# Patient Record
Sex: Male | Born: 1975 | Race: Black or African American | Hispanic: No | Marital: Married | Smoking: Former smoker
Health system: Southern US, Community
[De-identification: ages and names within clinical notes are randomized; demographics above are authoritative.]

## PROBLEM LIST (undated history)

## (undated) DIAGNOSIS — J45909 Unspecified asthma, uncomplicated: Secondary | ICD-10-CM

## (undated) DIAGNOSIS — I10 Essential (primary) hypertension: Secondary | ICD-10-CM

---

## 2003-02-03 ENCOUNTER — Emergency Department (HOSPITAL_COMMUNITY): Admission: EM | Admit: 2003-02-03 | Discharge: 2003-02-03 | Payer: Self-pay | Admitting: *Deleted

## 2003-06-11 ENCOUNTER — Emergency Department (HOSPITAL_COMMUNITY): Admission: EM | Admit: 2003-06-11 | Discharge: 2003-06-11 | Payer: Self-pay | Admitting: Emergency Medicine

## 2003-12-07 ENCOUNTER — Emergency Department (HOSPITAL_COMMUNITY): Admission: EM | Admit: 2003-12-07 | Discharge: 2003-12-08 | Payer: Self-pay | Admitting: *Deleted

## 2018-04-18 ENCOUNTER — Emergency Department
Admission: EM | Admit: 2018-04-18 | Discharge: 2018-04-18 | Disposition: A | Discharge status: Home / Self Care | Payer: BLUE CROSS/BLUE SHIELD | Attending: Emergency Medicine | Admitting: Emergency Medicine

## 2018-04-18 ENCOUNTER — Other Ambulatory Visit: Payer: Self-pay

## 2018-04-18 DIAGNOSIS — I10 Essential (primary) hypertension: Secondary | ICD-10-CM | POA: Diagnosis not present

## 2018-04-18 DIAGNOSIS — Z760 Encounter for issue of repeat prescription: Secondary | ICD-10-CM | POA: Insufficient documentation

## 2018-04-18 MED ORDER — LISINOPRIL 10 MG PO TABS
40.0000 mg | ORAL_TABLET | Freq: Once | ORAL | Status: AC
  Administered 2018-04-18: 40 mg via ORAL
  Filled 2018-04-18: qty 4

## 2018-04-18 MED ORDER — LISINOPRIL 40 MG PO TABS: 40.0000 mg | ORAL_TABLET | Freq: Every day | ORAL | 0 refills | Status: DC

## 2018-04-18 NOTE — ED Triage Notes (Signed)
Pt just got out of prison and needs to have bp med refilled. States has been out for a week, lisinopril 40mg 

## 2018-04-18 NOTE — ED Provider Notes (Signed)
[facility] Emergency Department Provider Note ____________________________________________  Time seen: 2045  I have reviewed the triage vital signs and the nursing notes.  HISTORY  Chief Complaint  Medication Refill   HPI Antonio Stone is a 42 y.o. male presents to the ER today for medication refill.  He reports he got out of prison 1 week ago.  He is prescribed Lisinopril 40 mg daily.  He has not had his medication in 1 week.  He does report slight headache but denies dizziness, visual changes, chest pain, chest tightness or shortness of breath.  He has been taking Lisinopril for a few years and has not had any complications.  No past medical history on file.  There are no active problems to display for this patient.   Prior to Admission medications   Medication Sig Start Date End Date Taking? Authorizing Provider  lisinopril (PRINIVIL,ZESTRIL) 40 MG tablet Take 1 tablet (40 mg total) by mouth daily. 04/18/18   Jearld Fenton, NP    Allergies Patient has no allergy information on record.  No family history on file.  Social History Social History   Tobacco Use  . Smoking status: Not on file  Substance Use Topics  . Alcohol use: Not on file  . Drug use: Not on file    Review of Systems  Constitutional: Negative for fever. Eyes: Negative for visual changes. Cardiovascular: Negative for chest pain. Respiratory: Negative for shortness of breath. Neurological: Positive for headache. negative for focal weakness or numbness. ____________________________________________  PHYSICAL EXAM:  VITAL SIGNS: ED Triage Vitals [04/18/18 2037]  Enc Vitals Group     BP (!) 151/90     Pulse Rate (!) 53     Resp 20     Temp 98.6 F (37 C)     Temp Source Oral     SpO2 100 %     Weight 210 lb (95.3 kg)     Height 5\' 11"  (1.803 m)     Head Circumference      Peak Flow      Pain Score 0     Pain Loc      Pain Edu?      Excl. in [place]?      Constitutional: Alert and oriented. Well appearing and in no distress. Eyes:  PERRL.  Cardiovascular: Normal rate, regular rhythm.  Respiratory: Normal respiratory effort. No wheezes/rales/rhonchi. Neurologic:  Normal gait without ataxia. Normal speech and language. No gross focal neurologic deficits are appreciated. ____________________________________________  INITIAL IMPRESSION / ASSESSMENT AND PLAN / ED COURSE  HTN:  Lisinopril 40 mg given in ER RX provided for Lisinopril 40 mg daily He understands he will need to find a PCP for further refills ____________________________________________  FINAL CLINICAL IMPRESSION(S) / ED DIAGNOSES  Final diagnoses:  Medication refill  Essential hypertension       Jearld Fenton, NP 04/18/18 2053    Nena Polio, MD 04/19/18 [phone]

## 2018-04-18 NOTE — Discharge Instructions (Addendum)
You have been diagnosed with HTN. We gave you a dose of Lisinopril in the ER. I have provided you a prescription for Lisinopril 40 mg daily. You will need to find a PCP for additional refills and to avoid running out of your medication

## 2018-05-05 ENCOUNTER — Emergency Department
Admission: EM | Admit: 2018-05-05 | Discharge: 2018-05-05 | Disposition: A | Discharge status: Home / Self Care | Payer: BLUE CROSS/BLUE SHIELD | Attending: Emergency Medicine | Admitting: Emergency Medicine

## 2018-05-05 ENCOUNTER — Other Ambulatory Visit: Payer: Self-pay

## 2018-05-05 ENCOUNTER — Emergency Department: Payer: BLUE CROSS/BLUE SHIELD

## 2018-05-05 ENCOUNTER — Encounter: Payer: Self-pay | Admitting: Emergency Medicine

## 2018-05-05 DIAGNOSIS — J45909 Unspecified asthma, uncomplicated: Secondary | ICD-10-CM | POA: Diagnosis not present

## 2018-05-05 DIAGNOSIS — Z79899 Other long term (current) drug therapy: Secondary | ICD-10-CM | POA: Diagnosis not present

## 2018-05-05 DIAGNOSIS — R079 Chest pain, unspecified: Secondary | ICD-10-CM | POA: Diagnosis present

## 2018-05-05 DIAGNOSIS — I10 Essential (primary) hypertension: Secondary | ICD-10-CM | POA: Insufficient documentation

## 2018-05-05 HISTORY — DX: Unspecified asthma, uncomplicated: J45.909

## 2018-05-05 HISTORY — DX: Essential (primary) hypertension: I10

## 2018-05-05 LAB — CBC
HEMATOCRIT: 39.8 % (ref 39.0–52.0)
Hemoglobin: 13.6 g/dL (ref 13.0–17.0)
MCH: 30.4 pg (ref 26.0–34.0)
MCHC: 34.2 g/dL (ref 30.0–36.0)
MCV: 89 fL (ref 80.0–100.0)
NRBC: 0 % (ref 0.0–0.2)
PLATELETS: 269 10*3/uL (ref 150–400)
RBC: 4.47 MIL/uL (ref 4.22–5.81)
RDW: 11.7 % (ref 11.5–15.5)
WBC: 5.4 10*3/uL (ref 4.0–10.5)

## 2018-05-05 LAB — TROPONIN I: Troponin I: <0.03 ng/mL (ref <0.03)

## 2018-05-05 LAB — BASIC METABOLIC PANEL
ANION GAP: 6 (ref 5–15)
BUN: 21 mg/dL — ABNORMAL HIGH (ref 6–20)
CHLORIDE: 106 mmol/L (ref 98–111)
CO2: 27 mmol/L (ref 22–32)
CREATININE: 1.11 mg/dL (ref 0.61–1.24)
Calcium: 8.8 mg/dL — ABNORMAL LOW (ref 8.9–10.3)
GFR calc non Af Amer: >60 mL/min (ref >60)
Glucose, Bld: 89 mg/dL (ref 70–99)
POTASSIUM: 3.6 mmol/L (ref 3.5–5.1)
SODIUM: 139 mmol/L (ref 135–145)

## 2018-05-05 NOTE — ED Notes (Signed)
Pt ambulatory to POV without difficulty. VSS. NAD. Discharge instructions, RX and follow up reviewed. All questions and concerns addressed.  

## 2018-05-05 NOTE — ED Triage Notes (Signed)
Patient presents to the ED with left sided sharp chest pain intermittent x 1 week, radiating down left arm.  Patient is in no obvious distress at this time.  Patient reports pain at this time.  Patient denies shortness of breath, nausea, or vomiting.  Patient states he exercises frequently.

## 2018-05-05 NOTE — ED Provider Notes (Signed)
[facility] Emergency Department Provider Note  Time seen: 7:48 PM  I have reviewed the triage vital signs and the nursing notes.   HISTORY  Chief Complaint Chest Pain    HPI Antonio Stone is a 42 y.o. male with a past medical history of hypertension, asthma, presents to the emergency department for chest pain.  According to the patient over the past 1 week he has been experiencing a sharp central chest pain.  States it comes and goes.  Denies any association with movement eating or deep breathing.  States it will come randomly and leave randomly.  Denies any leg pain or swelling.  Has had some intermittent tingling of the left fourth and fifth digits, states that is somewhat worse when he works out although has taken a week off and is noted some improvement in the tingling.  No shortness of breath or nausea.   Past Medical History:  Diagnosis Date  . Asthma   . Hypertension     There are no active problems to display for this patient.   History reviewed. No pertinent surgical history.  Prior to Admission medications   Medication Sig Start Date End Date Taking? Authorizing Provider  albuterol (PROVENTIL HFA;VENTOLIN HFA) 108 (90 Base) MCG/ACT inhaler Inhale 2 puffs into the lungs every 6 (six) hours as needed for wheezing or shortness of breath.   Yes [provider]  mometasone (ASMANEX) 220 MCG/INH inhaler Inhale 2 puffs into the lungs daily.   Yes [provider]  lisinopril (PRINIVIL,ZESTRIL) 40 MG tablet Take 1 tablet (40 mg total) by mouth daily. 04/18/18   Jearld Fenton, NP    No Known Allergies  No family history on file.  Social History Social History   Tobacco Use  . Smoking status: Never Smoker  . Smokeless tobacco: Never Used  Substance Use Topics  . Alcohol use: Never    Frequency: Never  . Drug use: Never    Review of Systems Constitutional: Negative for fever. Cardiovascular: Intermittent sharp chest pain  x1 week Respiratory: Negative for shortness of breath. Gastrointestinal: Negative for abdominal pain, vomiting  Genitourinary: Negative for urinary compaints Musculoskeletal: Negative for leg pain or swelling Skin: Negative for skin complaints  Neurological: Negative for headache.  Occasional tingling in the left fourth and fifth digits. All other ROS negative  ____________________________________________   PHYSICAL EXAM:  VITAL SIGNS: ED Triage Vitals [05/05/18 1855]  Enc Vitals Group     BP 138/81     Pulse Rate (!) 50     Resp 18     Temp 98.3 F (36.8 C)     Temp Source Oral     SpO2 98 %     Weight 208 lb (94.3 kg)     Height 5\' 11"  (1.803 m)     Head Circumference      Peak Flow      Pain Score 4     Pain Loc      Pain Edu?      Excl. in [place]?    Constitutional: Alert and oriented. Well appearing and in no distress.  Muscular build. Eyes: Normal exam ENT   Head: Normocephalic and atraumatic.   Mouth/Throat: Mucous membranes are moist. Cardiovascular: Normal rate, regular rhythm. No murmur Respiratory: Normal respiratory effort without tachypnea nor retractions. Breath sounds are clear Gastrointestinal: Soft and nontender. No distention.   Musculoskeletal: Nontender with normal range of motion in all extremities. No lower extremity tenderness or edema. Neurologic:  Normal speech and language. No gross focal neurologic deficits  Skin:  Skin is warm, dry and intact.  Psychiatric: Mood and affect are normal.   ____________________________________________    EKG  EKG reviewed and interpreted by myself shows sinus bradycardia 50 bpm with a narrow QRS, normal axis, slight PR prolongation otherwise largely normal intervals with no concerning ST changes.  ____________________________________________    RADIOLOGY  Chest x-ray is clear  ____________________________________________   INITIAL IMPRESSION / ASSESSMENT AND PLAN / ED COURSE  Pertinent labs &  imaging results that were available during my care of the patient were reviewed by me and considered in my medical decision making (see chart for details).  Patient presents to the emergency department for 1 week of intermittent chest pain.  Differential would include chest wall pain, pleurisy, muscular skeletal pain, ACS.  Overall the patient appears extremely well on examination.  Normal heart and lung sounds.  No lower extremity tenderness or edema, chest is nontender to palpation.  Patient's labs are reassuring including negative troponin, chest x-ray is normal and EKG is reassuring.  I discussed with the patient follow-up with cardiology for further evaluation and a stress test.  Patient states he did have a stress test back in 2013 while he was incarcerated and states it was normal.  ____________________________________________   FINAL CLINICAL IMPRESSION(S) / ED DIAGNOSES  Chest pain    Harvest Dark, MD 05/05/18 1951

## 2018-05-05 NOTE — ED Notes (Signed)
Pt to the er for chest pain intermittent x 1 week. Pt has a hx of HTN and asthma. Pt unable to state any factors for improvement or worsening. Pain is intermittent and sharp and is diaphoretic at times. Pain the midline under left breast. Pt has numbness to the medial right hand along the 4th and 5th digits.

## 2018-09-14 ENCOUNTER — Ambulatory Visit: Payer: Self-pay

## 2018-09-29 ENCOUNTER — Encounter (INDEPENDENT_AMBULATORY_CARE_PROVIDER_SITE_OTHER): Payer: Self-pay

## 2018-09-30 ENCOUNTER — Ambulatory Visit: Payer: Medicaid Other | Admitting: Gerontology

## 2018-09-30 ENCOUNTER — Other Ambulatory Visit: Payer: Self-pay

## 2018-09-30 ENCOUNTER — Encounter: Payer: Self-pay | Admitting: Gerontology

## 2018-09-30 VITALS — BP 130/80 | HR 61 | Temp 98.0°F | Ht 71.0 in | Wt 233.9 lb

## 2018-09-30 DIAGNOSIS — Z0000 Encounter for general adult medical examination without abnormal findings: Secondary | ICD-10-CM

## 2018-09-30 DIAGNOSIS — J45909 Unspecified asthma, uncomplicated: Secondary | ICD-10-CM

## 2018-09-30 DIAGNOSIS — K0889 Other specified disorders of teeth and supporting structures: Secondary | ICD-10-CM

## 2018-09-30 DIAGNOSIS — I10 Essential (primary) hypertension: Secondary | ICD-10-CM

## 2018-09-30 DIAGNOSIS — H538 Other visual disturbances: Secondary | ICD-10-CM

## 2018-09-30 DIAGNOSIS — Z7689 Persons encountering health services in other specified circumstances: Secondary | ICD-10-CM

## 2018-09-30 MED ORDER — LISINOPRIL 40 MG PO TABS
40.0000 mg | ORAL_TABLET | Freq: Every day | ORAL | 3 refills | Status: AC
Start: 2018-09-30 — End: ?

## 2018-09-30 MED ORDER — IPRATROPIUM BROMIDE 0.06 % NA SOLN: 2.0000 | Freq: Four times a day (QID) | NASAL | 3 refills | Status: AC

## 2018-09-30 MED ORDER — FLUTICASONE-SALMETEROL 250-50 MCG/DOSE IN AEPB: 1.0000 | INHALATION_SPRAY | Freq: Two times a day (BID) | RESPIRATORY_TRACT | 1 refills | Status: AC

## 2018-09-30 MED ORDER — ALBUTEROL SULFATE HFA 108 (90 BASE) MCG/ACT IN AERS: 2.0000 | INHALATION_SPRAY | Freq: Four times a day (QID) | RESPIRATORY_TRACT | 3 refills | Status: AC | PRN

## 2018-09-30 MED ORDER — MOMETASONE FUROATE 220 MCG/INH IN AEPB: 2.0000 | INHALATION_SPRAY | Freq: Every day | RESPIRATORY_TRACT | 3 refills | Status: DC

## 2018-09-30 NOTE — Progress Notes (Signed)
Patient ID: Antonio Stone, male   DOB: [birth year], 43 y.o.   MRN: [id number]  Chief Complaint  Patient presents with  . New Patient (Initial Visit)    establish care, high blood pressure, asthma, acute bronchitis    HPI Antonio Stone is a 43 y.o. male who presents for initial visit and evaluation of hypertension and asthma . Hypertension: He states that he was diagnosed with hypertension 12 years ago and has being taking 40 mg Lisinopril daily, and will soon run out of his medication. His blood pressure was 130/80 during office visit and he doesn't check his blood pressure at home. He denies cough, headache, light headedness and chest pain.  Asthma: He states that his Asthma is under control and he takes  Asmanex 2 puffs daily, Atrovent 2 puffs bid and Albuterol as needed. He denies chest tightness, wheezing, shortness of breath and cough.  Blurry vision: He states that he's having intermittent blurry vision to both eyes,and he wears glasses. He states that it has being over one year since he had an eye exam.   Tooth ache: He states that he's having intermittent discomfort to his right upper tooth. He reports that it started 1 month ago after he realized that the crown on the tooth fell off. He states that he experiences discomfort sometimes when he chews on the right side. He denies gum swelling, fever, chills, and otherwise no further concerns.  Past Medical History:  Diagnosis Date  . Asthma   . Hypertension     No past surgical history on file.  No family history on file.  Social History Social History   Tobacco Use  . Smoking status: Never Smoker  . Smokeless tobacco: Never Used  Substance Use Topics  . Alcohol use: Never    Frequency: Never  . Drug use: Never    No Known Allergies  Current Outpatient Medications  Medication Sig Dispense Refill  . albuterol (PROVENTIL HFA;VENTOLIN HFA) 108 (90 Base) MCG/ACT inhaler Inhale 2 puffs into the lungs every 6 (six) hours as  needed for wheezing or shortness of breath.    . lisinopril (PRINIVIL,ZESTRIL) 40 MG tablet Take 1 tablet (40 mg total) by mouth daily. 30 tablet 0  . mometasone (ASMANEX) 220 MCG/INH inhaler Inhale 2 puffs into the lungs daily.     No current facility-administered medications for this visit.     Review of Systems Review of Systems  Constitutional: Negative.   HENT: Negative.  Dental problem: tooth ache.   Eyes: Positive for visual disturbance (blurry vision).  Respiratory: Negative.   Cardiovascular: Negative.   Gastrointestinal: Negative.   Musculoskeletal: Negative.   Skin: Negative.   Neurological: Negative.   Hematological: Negative.   Psychiatric/Behavioral: Negative.     Height 5' 11" (1.803 m), weight 233 lb 14.4 oz (106.1 kg).  Physical Exam Physical Exam HENT:     Head: Normocephalic and atraumatic.     Nose: Nose normal.     Mouth/Throat:     Dentition: Normal dentition. No dental tenderness or dental abscesses.     Tongue: No lesions.     Pharynx: Oropharynx is clear.     Tonsils: No tonsillar exudate or tonsillar abscesses.  Eyes:     Pupils: Pupils are equal, round, and reactive to light.  Neck:     Musculoskeletal: Normal range of motion.  Cardiovascular:     Rate and Rhythm: Normal rate and regular rhythm.  Pulmonary:     Effort: Pulmonary effort is  normal.     Breath sounds: Normal breath sounds.  Abdominal:     General: Abdomen is flat. Bowel sounds are normal.     Palpations: Abdomen is soft.  Musculoskeletal: Normal range of motion.  Skin:    General: Skin is warm and dry.  Neurological:     General: No focal deficit present.     Mental Status: He is alert and oriented to person, place, and time.  Psychiatric:        Mood and Affect: Mood normal.        Behavior: Behavior normal.        Thought Content: Thought content normal.        Judgment: Judgment normal.     Data Reviewed - Medications and past medical history reviewed. Routine  labs ordered.  CBC w/Diff; Future - Comp Met (CMET); Future - Lipid panel; Future - HgB A1c; Future - Urinalysis; Future - TSH; Future   Assessment and Plan  1. Encounter to establish care  - CBC w/Diff; Future - Comp Met (CMET); Future - Lipid panel; Future - HgB A1c; Future - Urinalysis; Future - TSH; Future  2. Essential hypertension - Blood pressure is well controlled, will continue on current treatment regimen. . - Low salt DASH diet . Take medications regularly on time . Exercise regularly as tolerated . Check blood pressure at least once a week at home or a nearby pharmacy and record . Goal is less than 140/90 and normal blood pressure is 120/80  - lisinopril (PRINIVIL,ZESTRIL) 40 MG tablet; Take 1 tablet (40 mg total) by mouth daily.  Dispense: 30 tablet; Refill: 3  3. Mild asthma, unspecified whether complicated, unspecified whether persistent - He will continue on current treatment regimen. - albuterol (PROVENTIL HFA;VENTOLIN HFA) 108 (90 Base) MCG/ACT inhaler; Inhale 2 puffs into the lungs every 6 (six) hours as needed for wheezing or shortness of breath.  Dispense: 18 g; Refill: 3 - ipratropium (ATROVENT) 0.06 % nasal spray; Place 2 sprays into both nostrils 4 (four) times daily.  Dispense: 15 mL; Refill: 3 - mometasone (ASMANEX) 220 MCG/INH inhaler; Inhale 2 puffs into the lungs daily.  Dispense: 1 Inhaler; Refill: 3  4. Blurry vision - He is scheduled for Ophthalmology appointment.  5. Tooth ache - He was encouraged to complete application for -Dental referral.  6. Health care maintenance: He declines Influenza vaccine .                 Follow up in 3 months or if symptoms worsens.    Chioma E Iloabachie 09/30/2018, 9:14 AM   

## 2018-09-30 NOTE — Patient Instructions (Signed)
DASH Eating Plan  DASH stands for "Dietary Approaches to Stop Hypertension." The DASH eating plan is a healthy eating plan that has been shown to reduce high blood pressure (hypertension). It may also reduce your risk for type 2 diabetes, heart disease, and stroke. The DASH eating plan may also help with weight loss.  What are tips for following this plan?    General guidelines   Avoid eating more than 2,300 mg (milligrams) of salt (sodium) a day. If you have hypertension, you may need to reduce your sodium intake to 1,500 mg a day.   Limit alcohol intake to no more than 1 drink a day for nonpregnant women and 2 drinks a day for men. One drink equals 12 oz of beer, 5 oz of wine, or 1 oz of hard liquor.   Work with your health care provider to maintain a healthy body weight or to lose weight. Ask what an ideal weight is for you.   Get at least 30 minutes of exercise that causes your heart to beat faster (aerobic exercise) most days of the week. Activities may include walking, swimming, or biking.   Work with your health care provider or diet and nutrition specialist (dietitian) to adjust your eating plan to your individual calorie needs.  Reading food labels     Check food labels for the amount of sodium per serving. Choose foods with less than 5 percent of the Daily Value of sodium. Generally, foods with less than 300 mg of sodium per serving fit into this eating plan.   To find whole grains, look for the word "whole" as the first word in the ingredient list.  Shopping   Buy products labeled as "low-sodium" or "no salt added."   Buy fresh foods. Avoid canned foods and premade or frozen meals.  Cooking   Avoid adding salt when cooking. Use salt-free seasonings or herbs instead of table salt or sea salt. Check with your health care provider or pharmacist before using salt substitutes.   Do not fry foods. Cook foods using healthy methods such as baking, boiling, grilling, and broiling instead.   Cook with  heart-healthy oils, such as olive, canola, soybean, or sunflower oil.  Meal planning   Eat a balanced diet that includes:  ? 5 or more servings of fruits and vegetables each day. At each meal, try to fill half of your plate with fruits and vegetables.  ? Up to 6-8 servings of whole grains each day.  ? Less than 6 oz of lean meat, poultry, or fish each day. A 3-oz serving of meat is about the same size as a deck of cards. One egg equals 1 oz.  ? 2 servings of low-fat dairy each day.  ? A serving of nuts, seeds, or beans 5 times each week.  ? Heart-healthy fats. Healthy fats called Omega-3 fatty acids are found in foods such as flaxseeds and coldwater fish, like sardines, salmon, and mackerel.   Limit how much you eat of the following:  ? Canned or prepackaged foods.  ? Food that is high in trans fat, such as fried foods.  ? Food that is high in saturated fat, such as fatty meat.  ? Sweets, desserts, sugary drinks, and other foods with added sugar.  ? Full-fat dairy products.   Do not salt foods before eating.   Try to eat at least 2 vegetarian meals each week.   Eat more home-cooked food and less restaurant, buffet, and fast food.     When eating at a restaurant, ask that your food be prepared with less salt or no salt, if possible.  What foods are recommended?  The items listed may not be a complete list. Talk with your dietitian about what dietary choices are best for you.  Grains  Whole-grain or whole-wheat bread. Whole-grain or whole-wheat pasta. Brown rice. Oatmeal. Quinoa. Bulgur. Whole-grain and low-sodium cereals. Pita bread. Low-fat, low-sodium crackers. Whole-wheat flour tortillas.  Vegetables  Fresh or frozen vegetables (raw, steamed, roasted, or grilled). Low-sodium or reduced-sodium tomato and vegetable juice. Low-sodium or reduced-sodium tomato sauce and tomato paste. Low-sodium or reduced-sodium canned vegetables.  Fruits  All fresh, dried, or frozen fruit. Canned fruit in natural juice (without  added sugar).  Meat and other protein foods  Skinless chicken or turkey. Ground chicken or turkey. Pork with fat trimmed off. Fish and seafood. Egg whites. Dried beans, peas, or lentils. Unsalted nuts, nut butters, and seeds. Unsalted canned beans. Lean cuts of beef with fat trimmed off. Low-sodium, lean deli meat.  Dairy  Low-fat (1%) or fat-free (skim) milk. Fat-free, low-fat, or reduced-fat cheeses. Nonfat, low-sodium ricotta or cottage cheese. Low-fat or nonfat yogurt. Low-fat, low-sodium cheese.  Fats and oils  Soft margarine without trans fats. Vegetable oil. Low-fat, reduced-fat, or light mayonnaise and salad dressings (reduced-sodium). Canola, safflower, olive, soybean, and sunflower oils. Avocado.  Seasoning and other foods  Herbs. Spices. Seasoning mixes without salt. Unsalted popcorn and pretzels. Fat-free sweets.  What foods are not recommended?  The items listed may not be a complete list. Talk with your dietitian about what dietary choices are best for you.  Grains  Baked goods made with fat, such as croissants, muffins, or some breads. Dry pasta or rice meal packs.  Vegetables  Creamed or fried vegetables. Vegetables in a cheese sauce. Regular canned vegetables (not low-sodium or reduced-sodium). Regular canned tomato sauce and paste (not low-sodium or reduced-sodium). Regular tomato and vegetable juice (not low-sodium or reduced-sodium). Pickles. Olives.  Fruits  Canned fruit in a light or heavy syrup. Fried fruit. Fruit in cream or butter sauce.  Meat and other protein foods  Fatty cuts of meat. Ribs. Fried meat. Bacon. Sausage. Bologna and other processed lunch meats. Salami. Fatback. Hotdogs. Bratwurst. Salted nuts and seeds. Canned beans with added salt. Canned or smoked fish. Whole eggs or egg yolks. Chicken or turkey with skin.  Dairy  Whole or 2% milk, cream, and half-and-half. Whole or full-fat cream cheese. Whole-fat or sweetened yogurt. Full-fat cheese. Nondairy creamers. Whipped toppings.  Processed cheese and cheese spreads.  Fats and oils  Butter. Stick margarine. Lard. Shortening. Ghee. Bacon fat. Tropical oils, such as coconut, palm kernel, or palm oil.  Seasoning and other foods  Salted popcorn and pretzels. Onion salt, garlic salt, seasoned salt, table salt, and sea salt. Worcestershire sauce. Tartar sauce. Barbecue sauce. Teriyaki sauce. Soy sauce, including reduced-sodium. Steak sauce. Canned and packaged gravies. Fish sauce. Oyster sauce. Cocktail sauce. Horseradish that you find on the shelf. Ketchup. Mustard. Meat flavorings and tenderizers. Bouillon cubes. Hot sauce and Tabasco sauce. Premade or packaged marinades. Premade or packaged taco seasonings. Relishes. Regular salad dressings.  Where to find more information:   National Heart, Lung, and Blood Institute: www.nhlbi.nih.gov   American Heart Association: www.heart.org  Summary   The DASH eating plan is a healthy eating plan that has been shown to reduce high blood pressure (hypertension). It may also reduce your risk for type 2 diabetes, heart disease, and stroke.   With the   DASH eating plan, you should limit salt (sodium) intake to 2,300 mg a day. If you have hypertension, you may need to reduce your sodium intake to 1,500 mg a day.   When on the DASH eating plan, aim to eat more fresh fruits and vegetables, whole grains, lean proteins, low-fat dairy, and heart-healthy fats.   Work with your health care provider or diet and nutrition specialist (dietitian) to adjust your eating plan to your individual calorie needs.  This information is not intended to replace advice given to you by your health care provider. Make sure you discuss any questions you have with your health care provider.  Document Released: 06/19/2011 Document Revised: 06/23/2016 Document Reviewed: 06/23/2016  Elsevier Interactive Patient Education  2019 Elsevier Inc.

## 2018-10-01 LAB — CBC WITH DIFFERENTIAL/PLATELET
BASOS: 1 %
Basophils Absolute: 0.1 10*3/uL (ref 0.0–0.2)
EOS (ABSOLUTE): 0.3 10*3/uL (ref 0.0–0.4)
Eos: 5 %
Hematocrit: 43.3 % (ref 37.5–51.0)
Hemoglobin: 14.8 g/dL (ref 13.0–17.7)
IMMATURE GRANULOCYTES: 0 %
Immature Grans (Abs): 0 10*3/uL (ref 0.0–0.1)
Lymphocytes Absolute: 2.2 10*3/uL (ref 0.7–3.1)
Lymphs: 45 %
MCH: 30.3 pg (ref 26.6–33.0)
MCHC: 34.2 g/dL (ref 31.5–35.7)
MCV: 89 fL (ref 79–97)
MONOS ABS: 0.5 10*3/uL (ref 0.1–0.9)
Monocytes: 10 %
NEUTROS PCT: 39 %
Neutrophils Absolute: 2 10*3/uL (ref 1.4–7.0)
PLATELETS: 279 10*3/uL (ref 150–450)
RBC: 4.88 x10E6/uL (ref 4.14–5.80)
RDW: 12.4 % (ref 11.6–15.4)
WBC: 5 10*3/uL (ref 3.4–10.8)

## 2018-10-01 LAB — COMPREHENSIVE METABOLIC PANEL
A/G RATIO: 1.8 (ref 1.2–2.2)
ALT: 28 IU/L (ref 0–44)
AST: 30 IU/L (ref 0–40)
Albumin: 4.4 g/dL (ref 4.0–5.0)
Alkaline Phosphatase: 81 IU/L (ref 39–117)
BILIRUBIN TOTAL: 0.7 mg/dL (ref 0.0–1.2)
BUN/Creatinine Ratio: 17 (ref 9–20)
BUN: 19 mg/dL (ref 6–24)
CALCIUM: 9.7 mg/dL (ref 8.7–10.2)
CO2: 21 mmol/L (ref 20–29)
Chloride: 105 mmol/L (ref 96–106)
Creatinine, Ser: 1.14 mg/dL (ref 0.76–1.27)
GFR calc Af Amer: 91 mL/min/{1.73_m2} (ref >59)
GFR, EST NON AFRICAN AMERICAN: 78 mL/min/{1.73_m2} (ref >59)
GLOBULIN, TOTAL: 2.4 g/dL (ref 1.5–4.5)
Glucose: 85 mg/dL (ref 65–99)
POTASSIUM: 4.8 mmol/L (ref 3.5–5.2)
SODIUM: 143 mmol/L (ref 134–144)
Total Protein: 6.8 g/dL (ref 6.0–8.5)

## 2018-10-01 LAB — URINALYSIS
Bilirubin, UA: NEGATIVE
Glucose, UA: NEGATIVE
Ketones, UA: NEGATIVE
LEUKOCYTES UA: NEGATIVE
NITRITE UA: NEGATIVE
PH UA: 5 (ref 5.0–7.5)
Protein, UA: NEGATIVE
RBC, UA: NEGATIVE
Specific Gravity, UA: 1.022 (ref 1.005–1.030)
Urobilinogen, Ur: 0.2 mg/dL (ref 0.2–1.0)

## 2018-10-01 LAB — LIPID PANEL
CHOLESTEROL TOTAL: 128 mg/dL (ref 100–199)
Chol/HDL Ratio: 2.9 ratio (ref 0.0–5.0)
HDL: 44 mg/dL (ref >39)
LDL CALC: 74 mg/dL (ref 0–99)
TRIGLYCERIDES: 50 mg/dL (ref 0–149)
VLDL Cholesterol Cal: 10 mg/dL (ref 5–40)

## 2018-10-01 LAB — HEMOGLOBIN A1C
Est. average glucose Bld gHb Est-mCnc: 111 mg/dL
Hgb A1c MFr Bld: 5.5 % (ref 4.8–5.6)

## 2018-10-01 LAB — TSH: TSH: 1.71 u[IU]/mL (ref 0.450–4.500)

## 2018-10-04 ENCOUNTER — Ambulatory Visit: Payer: BLUE CROSS/BLUE SHIELD | Admitting: Pharmacy Technician

## 2018-10-04 ENCOUNTER — Other Ambulatory Visit: Payer: Self-pay

## 2018-10-04 DIAGNOSIS — Z79899 Other long term (current) drug therapy: Secondary | ICD-10-CM

## 2018-10-04 NOTE — Progress Notes (Signed)
Completed phone consult.  Patient had previously completed application.  Went over application with patient.    Patient still needs to provide last 30 days of paystubs and 2019 tax return when available.  Verbally read [facility]'s contract with patient over the phone.  Patient verbally acknowledged that he understood and had no questions about the contract.  Mailing contract to patient.  Patient to sign and return to [facility].    Explained that we need to order Ventolin and Advair from [employer].  Mailing to patient to sign and sending to [facility] for provider to sign.  Upon receipt of signed portions of the PAP Application from patient and provider, and receipt of financial information from patient, PAP Applications will be submitted to [place].  [facility]

## 2018-10-05 ENCOUNTER — Telehealth: Payer: Self-pay | Admitting: Pharmacy Technician

## 2018-10-05 NOTE — Telephone Encounter (Signed)
Discovered patient has [employer] [place].  Subscriber ID# [id number].  Spoke with Barista at [employer].  Jinny Blossom verified that policy is still active and that patient has prescription benefits with [facility] in [place].    Contacted patient to make aware and notify that we need a termination letter in order to provide additional medication assistance.  Patient stated that the [employer]BS was inactive and he owed a Engineer, technical sales with [employer].  Explained that I had contacted someone from [place] and they verified that policy is still active and that he has prescription benefits.  Provided patient with contact information for [employer]BS.  Also, provided patient with fax number to forward termination letter.  [facility]

## 2018-12-15 ENCOUNTER — Other Ambulatory Visit: Payer: Self-pay | Admitting: Physician Assistant

## 2018-12-15 DIAGNOSIS — H4901 Third [oculomotor] nerve palsy, right eye: Secondary | ICD-10-CM

## 2018-12-30 ENCOUNTER — Ambulatory Visit: Payer: Medicaid Other | Admitting: Gerontology

## 2019-01-04 ENCOUNTER — Ambulatory Visit
Admission: RE | Admit: 2019-01-04 | Discharge: 2019-01-04 | Disposition: A | Discharge status: Home / Self Care | Payer: Managed Care, Other (non HMO) | Source: Ambulatory Visit | Attending: Physician Assistant | Admitting: Physician Assistant

## 2019-01-04 ENCOUNTER — Other Ambulatory Visit: Payer: Self-pay

## 2019-01-04 DIAGNOSIS — H4901 Third [oculomotor] nerve palsy, right eye: Secondary | ICD-10-CM

## 2019-01-04 LAB — POCT I-STAT CREATININE: Creatinine, Ser: 1.2 mg/dL (ref 0.61–1.24)

## 2019-01-04 MED ORDER — IOHEXOL 350 MG/ML SOLN
75.0000 mL | Freq: Once | INTRAVENOUS | Status: AC | PRN
  Administered 2019-01-04: 75 mL via INTRAVENOUS

## 2019-05-16 ENCOUNTER — Other Ambulatory Visit: Payer: Self-pay | Admitting: *Deleted

## 2019-05-16 DIAGNOSIS — Z20822 Contact with and (suspected) exposure to covid-19: Secondary | ICD-10-CM

## 2019-05-17 ENCOUNTER — Telehealth: Payer: Self-pay

## 2019-05-17 LAB — NOVEL CORONAVIRUS, NAA: SARS-CoV-2, NAA: DETECTED — AB

## 2019-05-17 NOTE — Telephone Encounter (Signed)
Received call from patient checking Covid results.  Advised results are positive.  Advised to contact PCP or visit ED if symptoms worsen.   

## 2019-05-27 ENCOUNTER — Other Ambulatory Visit: Payer: Self-pay

## 2019-05-27 DIAGNOSIS — Z20822 Contact with and (suspected) exposure to covid-19: Secondary | ICD-10-CM

## 2019-05-30 LAB — NOVEL CORONAVIRUS, NAA: SARS-CoV-2, NAA: NOT DETECTED

## 2020-11-15 ENCOUNTER — Telehealth: Payer: Self-pay | Admitting: Gastroenterology

## 2020-11-15 NOTE — Telephone Encounter (Signed)
Patient came by office to bring his referral.  The referral was not complete.  I called and spoke to someone at [facility] who assured me she would send that over through fax.  I also faxed a request to [facility].

## 2020-11-21 ENCOUNTER — Other Ambulatory Visit: Payer: Self-pay

## 2020-11-21 ENCOUNTER — Telehealth: Payer: Self-pay

## 2020-11-21 DIAGNOSIS — Z1211 Encounter for screening for malignant neoplasm of colon: Secondary | ICD-10-CM

## 2020-11-21 MED ORDER — NA SULFATE-K SULFATE-MG SULF 17.5-3.13-1.6 GM/177ML PO SOLN: 354.0000 mL | Freq: Once | ORAL | 0 refills | Status: AC

## 2020-11-21 NOTE — Telephone Encounter (Signed)
Gastroenterology Pre-Procedure Review  Request Date: 12/03/2020 Requesting Physician: Dr. Bonna Gains   PATIENT REVIEW QUESTIONS: The patient responded to the following health history questions as indicated:    1. Are you having any GI issues? Yes constipation Twice a day  2. Do you have a personal history of Polyps? no 3. Do you have a family history of Colon Cancer or Polyps? no 4. Diabetes Mellitus? no 5. Joint replacements in the past 12 months?no 6. Major health problems in the past 3 months?no 7. Any artificial heart valves, MVP, or defibrillator?no    MEDICATIONS & ALLERGIES:    Patient reports the following regarding taking any anticoagulation/antiplatelet therapy:   Plavix, Coumadin, Eliquis, Xarelto, Lovenox, Pradaxa, Brilinta, or Effient? no Aspirin? no  Patient confirms/reports the following medications:  Current Outpatient Medications  Medication Sig Dispense Refill  . albuterol (PROVENTIL HFA;VENTOLIN HFA) 108 (90 Base) MCG/ACT inhaler Inhale 2 puffs into the lungs every 6 (six) hours as needed for wheezing or shortness of breath. 18 g 3  . Fluticasone-Salmeterol (ADVAIR) 250-50 MCG/DOSE AEPB Inhale 1 puff into the lungs 2 (two) times daily. 60 each 1  . ipratropium (ATROVENT) 0.06 % nasal spray Place 2 sprays into both nostrils 4 (four) times daily. 15 mL 3  . lisinopril (PRINIVIL,ZESTRIL) 40 MG tablet Take 1 tablet (40 mg total) by mouth daily. 30 tablet 3   No current facility-administered medications for this visit.    Patient confirms/reports the following allergies:  No Known Allergies  No orders of the defined types were placed in this encounter.   AUTHORIZATION INFORMATION Primary Insurance: 1D#: Group #:  Secondary Insurance: 1D#: Group #:  SCHEDULE INFORMATION: Date:  Time: Location:

## 2020-11-22 ENCOUNTER — Encounter: Payer: Self-pay | Admitting: Family Medicine

## 2020-12-03 ENCOUNTER — Ambulatory Visit: Payer: BC Managed Care – PPO | Admitting: Anesthesiology

## 2020-12-03 ENCOUNTER — Encounter
Admission: RE | Disposition: A | Discharge status: Home / Self Care | Payer: Self-pay | Source: Ambulatory Visit | Attending: Gastroenterology

## 2020-12-03 ENCOUNTER — Encounter: Payer: Self-pay | Admitting: Gastroenterology

## 2020-12-03 ENCOUNTER — Ambulatory Visit
Admission: RE | Admit: 2020-12-03 | Discharge: 2020-12-03 | Disposition: A | Discharge status: Home / Self Care | Payer: BC Managed Care – PPO | Source: Ambulatory Visit | Attending: Gastroenterology | Admitting: Gastroenterology

## 2020-12-03 DIAGNOSIS — D120 Benign neoplasm of cecum: Secondary | ICD-10-CM | POA: Diagnosis not present

## 2020-12-03 DIAGNOSIS — Z7951 Long term (current) use of inhaled steroids: Secondary | ICD-10-CM | POA: Diagnosis not present

## 2020-12-03 DIAGNOSIS — K648 Other hemorrhoids: Secondary | ICD-10-CM | POA: Diagnosis not present

## 2020-12-03 DIAGNOSIS — Z79899 Other long term (current) drug therapy: Secondary | ICD-10-CM | POA: Diagnosis not present

## 2020-12-03 DIAGNOSIS — K635 Polyp of colon: Secondary | ICD-10-CM

## 2020-12-03 DIAGNOSIS — J45909 Unspecified asthma, uncomplicated: Secondary | ICD-10-CM | POA: Insufficient documentation

## 2020-12-03 DIAGNOSIS — I10 Essential (primary) hypertension: Secondary | ICD-10-CM | POA: Insufficient documentation

## 2020-12-03 DIAGNOSIS — Z1211 Encounter for screening for malignant neoplasm of colon: Secondary | ICD-10-CM | POA: Diagnosis present

## 2020-12-03 HISTORY — PX: COLONOSCOPY WITH PROPOFOL: SHX5780

## 2020-12-03 SURGERY — COLONOSCOPY WITH PROPOFOL
Anesthesia: General

## 2020-12-03 MED ORDER — SODIUM CHLORIDE 0.9 % IV SOLN
INTRAVENOUS | Status: DC
  Administered 2020-12-03: 20 mL/h via INTRAVENOUS

## 2020-12-03 MED ORDER — LIDOCAINE HCL (CARDIAC) PF 100 MG/5ML IV SOSY
PREFILLED_SYRINGE | INTRAVENOUS | Status: DC | PRN
  Administered 2020-12-03: 60 mg via INTRAVENOUS

## 2020-12-03 MED ORDER — PROPOFOL 500 MG/50ML IV EMUL
INTRAVENOUS | Status: DC | PRN
  Administered 2020-12-03: 140 ug/kg/min via INTRAVENOUS

## 2020-12-03 MED ORDER — PROPOFOL 10 MG/ML IV BOLUS
INTRAVENOUS | Status: DC | PRN
  Administered 2020-12-03: 70 mg via INTRAVENOUS

## 2020-12-03 MED ORDER — GLYCOPYRROLATE 0.2 MG/ML IJ SOLN
INTRAMUSCULAR | Status: DC | PRN
  Administered 2020-12-03: .2 mg via INTRAVENOUS

## 2020-12-03 NOTE — Transfer of Care (Signed)
Immediate Anesthesia Transfer of Care Note  Patient: Antonio Stone  Procedure(s) Performed: COLONOSCOPY WITH PROPOFOL (N/A )  Patient Location: PACU  Anesthesia Type:General  Level of Consciousness: sedated  Airway & Oxygen Therapy: Patient Spontanous Breathing and Patient connected to nasal cannula oxygen  Post-op Assessment: Report given to RN and Post -op Vital signs reviewed and stable  Post vital signs: Reviewed and stable  Last Vitals:  Vitals Value Taken Time  BP 116/59 12/03/20 1040  Temp 35.7 C 12/03/20 1040  Pulse 54 12/03/20 1045  Resp 21 12/03/20 1045  SpO2 97 % 12/03/20 1045  Vitals shown include unvalidated device data.  Last Pain:  Vitals:   12/03/20 1040  TempSrc: Tympanic  PainSc: Asleep         Complications: No complications documented.

## 2020-12-03 NOTE — H&P (Signed)
  Vonda Antigua, MD [address] [place], [place], [place], [address] [address], [place], [place], [place], [address] Phone: [phone]  Fax: [phone]  Primary Care Physician:  Denton Lank, MD   Pre-Procedure History & Physical: HPI:  Antonio Stone is a 45 y.o. male is here for a colonoscopy.   Past Medical History:  Diagnosis Date  . Asthma   . Hypertension     No past surgical history on file.  Prior to Admission medications   Medication Sig Start Date End Date Taking? Authorizing Provider  albuterol (PROVENTIL HFA;VENTOLIN HFA) 108 (90 Base) MCG/ACT inhaler Inhale 2 puffs into the lungs every 6 (six) hours as needed for wheezing or shortness of breath. 09/30/18  Yes Iloabachie, Chioma E, NP  Fluticasone-Salmeterol (ADVAIR) 250-50 MCG/DOSE AEPB Inhale 1 puff into the lungs 2 (two) times daily. 09/30/18  Yes Iloabachie, Chioma E, NP  ipratropium (ATROVENT) 0.06 % nasal spray Place 2 sprays into both nostrils 4 (four) times daily. 09/30/18  Yes Iloabachie, Chioma E, NP  lisinopril (PRINIVIL,ZESTRIL) 40 MG tablet Take 1 tablet (40 mg total) by mouth daily. 09/30/18  Yes Iloabachie, Chioma E, NP    Allergies as of 11/21/2020  . (No Known Allergies)    No family history on file.  Social History   Socioeconomic History  . Marital status: Married    Spouse name: Not on file  . Number of children: Not on file  . Years of education: Not on file  . Highest education level: Not on file  Occupational History  . Not on file  Tobacco Use  . Smoking status: Never Smoker  . Smokeless tobacco: Never Used  Vaping Use  . Vaping Use: Never used  Substance and Sexual Activity  . Alcohol use: Never  . Drug use: Never  . Sexual activity: Not on file  Other Topics Concern  . Not on file  Social History Narrative  . Not on file   Social Determinants of Health   Financial Resource Strain: Not on file  Food Insecurity: Not on file  Transportation Needs: Not on file   Physical Activity: Not on file  Stress: Not on file  Social Connections: Not on file  Intimate Partner Violence: Not on file    Review of Systems: See HPI, otherwise negative ROS  Physical Exam: BP (!) 152/91   Pulse (!) 54   Temp (!) 97.4 F (36.3 C) (Temporal)   Resp 20   Ht 5\' 11"  (1.803 m)   Wt 102.5 kg   SpO2 99%   BMI 31.52 kg/m  General:   Alert,  pleasant and cooperative in NAD Head:  Normocephalic and atraumatic. Neck:  Supple; no masses or thyromegaly. Lungs:  Clear throughout to auscultation, normal respiratory effort.    Heart:  +S1, +S2, Regular rate and rhythm, No edema. Abdomen:  Soft, nontender and nondistended. Normal bowel sounds, without guarding, and without rebound.   Neurologic:  Alert and  oriented x4;  grossly normal neurologically.  Impression/Plan: Antonio Stone is here for a colonoscopy to be performed for average risk screening.  Risks, benefits, limitations, and alternatives regarding  colonoscopy have been reviewed with the patient.  Questions have been answered.  All parties agreeable.   Virgel Manifold, MD  12/03/2020, 10:01 AM

## 2020-12-03 NOTE — Op Note (Signed)
[facility] Gastroenterology Patient Name: Jiraiya Mcewan Procedure Date: 12/03/2020 10:03 AM MRN: [id number] Account #: [id number] Date of Birth: [birth year] Admit Type: Outpatient Age: 45 Room: [facility] ENDO ROOM 2 Gender: Male Note Status: Finalized Procedure:             Colonoscopy Indications:           Screening for colorectal malignant neoplasm Providers:             Avrey Flanagin B. Bonna Gains MD, MD Medicines:             Monitored Anesthesia Care Complications:         No immediate complications. Procedure:             Pre-Anesthesia Assessment:                        - ASA Grade Assessment: II - A patient with mild                         systemic disease.                        - Prior to the procedure, a History and Physical was                         performed, and patient medications, allergies and                         sensitivities were reviewed. The patient's tolerance                         of previous anesthesia was reviewed.                        - The risks and benefits of the procedure and the                         sedation options and risks were discussed with the                         patient. All questions were answered and informed                         consent was obtained.                        - Patient identification and proposed procedure were                         verified prior to the procedure by the physician, the                         nurse, the anesthesiologist, the anesthetist and the                         technician. The procedure was verified in the                         procedure room.  After obtaining informed consent, the colonoscope was                         passed under direct vision. Throughout the procedure,                         the patient's blood pressure, pulse, and oxygen                         saturations were monitored continuously. The                         Colonoscope  was introduced through the anus and                         advanced to the the cecum, identified by appendiceal                         orifice and ileocecal valve. The colonoscopy was                         performed with ease. The patient tolerated the                         procedure well. The quality of the bowel preparation                         was good. Findings:      The perianal and digital rectal examinations were normal.      A 4 mm polyp was found in the cecum. The polyp was sessile. The polyp       was removed with a cold snare. Resection and retrieval were complete.      The exam was otherwise without abnormality.      The rectum, sigmoid colon, descending colon, transverse colon, ascending       colon and cecum appeared normal.      Non-bleeding internal hemorrhoids were found during retroflexion. Impression:            - One 4 mm polyp in the cecum, removed with a cold                         snare. Resected and retrieved.                        - The examination was otherwise normal.                        - The rectum, sigmoid colon, descending colon,                         transverse colon, ascending colon and cecum are normal.                        - Non-bleeding internal hemorrhoids. Recommendation:        - Discharge patient to home (with escort).                        - Advance diet as tolerated.                        -  Continue present medications.                        - Await pathology results.                        - Repeat colonoscopy date to be determined after                         pending pathology results are reviewed.                        - The findings and recommendations were discussed with                         the patient.                        - The findings and recommendations were discussed with                         the patient's family.                        - Return to primary care physician as previously                          scheduled.                        - High fiber diet. Procedure Code(s):     --- Professional ---                        [phone], Colonoscopy, flexible; with removal of                         tumor(s), polyp(s), or other lesion(s) by snare                         technique Diagnosis Code(s):     --- Professional ---                        Z12.11, Encounter for screening for malignant neoplasm                         of colon                        K63.5, Polyp of colon CPT copyright 2019 American Medical Association. All rights reserved. The codes documented in this report are preliminary and upon coder review may  be revised to meet current compliance requirements.  Vonda Antigua, MD Margretta Sidle B. Bonna Gains MD, MD 12/03/2020 10:38:25 AM This report has been signed electronically. Number of Addenda: 0 Note Initiated On: 12/03/2020 10:03 AM Scope Withdrawal Time: 0 hours 14 minutes 58 seconds  Total Procedure Duration: 0 hours 19 minutes 50 seconds  Estimated Blood Loss:  Estimated blood loss: none.      [facility]

## 2020-12-03 NOTE — Anesthesia Postprocedure Evaluation (Signed)
Anesthesia Post Note  Patient: Antonio Stone  Procedure(s) Performed: COLONOSCOPY WITH PROPOFOL (N/A )  Patient location during evaluation: Endoscopy Anesthesia Type: General Level of consciousness: awake and alert Pain management: pain level controlled Vital Signs Assessment: post-procedure vital signs reviewed and stable Respiratory status: spontaneous breathing, nonlabored ventilation, respiratory function stable and patient connected to nasal cannula oxygen Cardiovascular status: blood pressure returned to baseline and stable Postop Assessment: no apparent nausea or vomiting Anesthetic complications: no   No complications documented.   Last Vitals:  Vitals:   12/03/20 1100 12/03/20 1110  BP: 135/75 137/74  Pulse: 60 (!) 47  Resp: 15 20  Temp:    SpO2: 98% 99%    Last Pain:  Vitals:   12/03/20 1110  TempSrc:   PainSc: 0-No pain                 Precious Haws Sorrel Cassetta
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
## 2020-12-04 ENCOUNTER — Encounter: Payer: Self-pay | Admitting: Gastroenterology

## 2020-12-04 LAB — SURGICAL PATHOLOGY

## 2021-04-23 ENCOUNTER — Other Ambulatory Visit: Payer: Self-pay | Admitting: Family Medicine

## 2021-04-23 DIAGNOSIS — J90 Pleural effusion, not elsewhere classified: Secondary | ICD-10-CM

## 2021-04-23 DIAGNOSIS — J449 Chronic obstructive pulmonary disease, unspecified: Secondary | ICD-10-CM

## 2021-04-23 DIAGNOSIS — R0781 Pleurodynia: Secondary | ICD-10-CM

## 2021-07-04 ENCOUNTER — Ambulatory Visit
Admission: RE | Admit: 2021-07-04 | Discharge: 2021-07-04 | Disposition: A | Discharge status: Home / Self Care | Payer: BC Managed Care – PPO | Attending: Family Medicine | Admitting: Family Medicine

## 2021-07-04 ENCOUNTER — Ambulatory Visit
Admission: RE | Admit: 2021-07-04 | Discharge: 2021-07-04 | Disposition: A | Discharge status: Home / Self Care | Payer: BC Managed Care – PPO | Source: Ambulatory Visit | Attending: Family Medicine | Admitting: Family Medicine

## 2021-07-04 ENCOUNTER — Emergency Department
Admission: EM | Admit: 2021-07-04 | Discharge: 2021-07-04 | Discharge status: Absent without leave | Payer: BC Managed Care – PPO | Source: Home / Self Care

## 2021-07-04 DIAGNOSIS — R0781 Pleurodynia: Secondary | ICD-10-CM

## 2021-07-04 DIAGNOSIS — J449 Chronic obstructive pulmonary disease, unspecified: Secondary | ICD-10-CM

## 2021-07-04 DIAGNOSIS — J90 Pleural effusion, not elsewhere classified: Secondary | ICD-10-CM

## 2021-11-22 ENCOUNTER — Other Ambulatory Visit: Payer: Self-pay

## 2021-11-22 ENCOUNTER — Ambulatory Visit: Payer: BC Managed Care – PPO | Admitting: Podiatry

## 2021-11-22 DIAGNOSIS — B351 Tinea unguium: Secondary | ICD-10-CM | POA: Diagnosis not present

## 2021-11-22 MED ORDER — TERBINAFINE HCL 250 MG PO TABS
250.0000 mg | ORAL_TABLET | Freq: Every day | ORAL | 0 refills | Status: AC
  Filled 2021-11-22: qty 90, 90d supply, fill #0
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
## 2021-12-12 ENCOUNTER — Institutional Professional Consult (permissible substitution): Payer: BC Managed Care – PPO | Admitting: Internal Medicine

## 2022-05-11 IMAGING — CR DG CHEST 2V
1 series · 2 of 2 positions shown · non-contrast
Comparison: 05/05/2018

CLINICAL DATA: Right pleural effusion

EXAM:
CHEST - 2 VIEW

[Series 1: dg chest 2 view · 0.14mm/px · 2 of 2 slices shown]
[im 1/2]
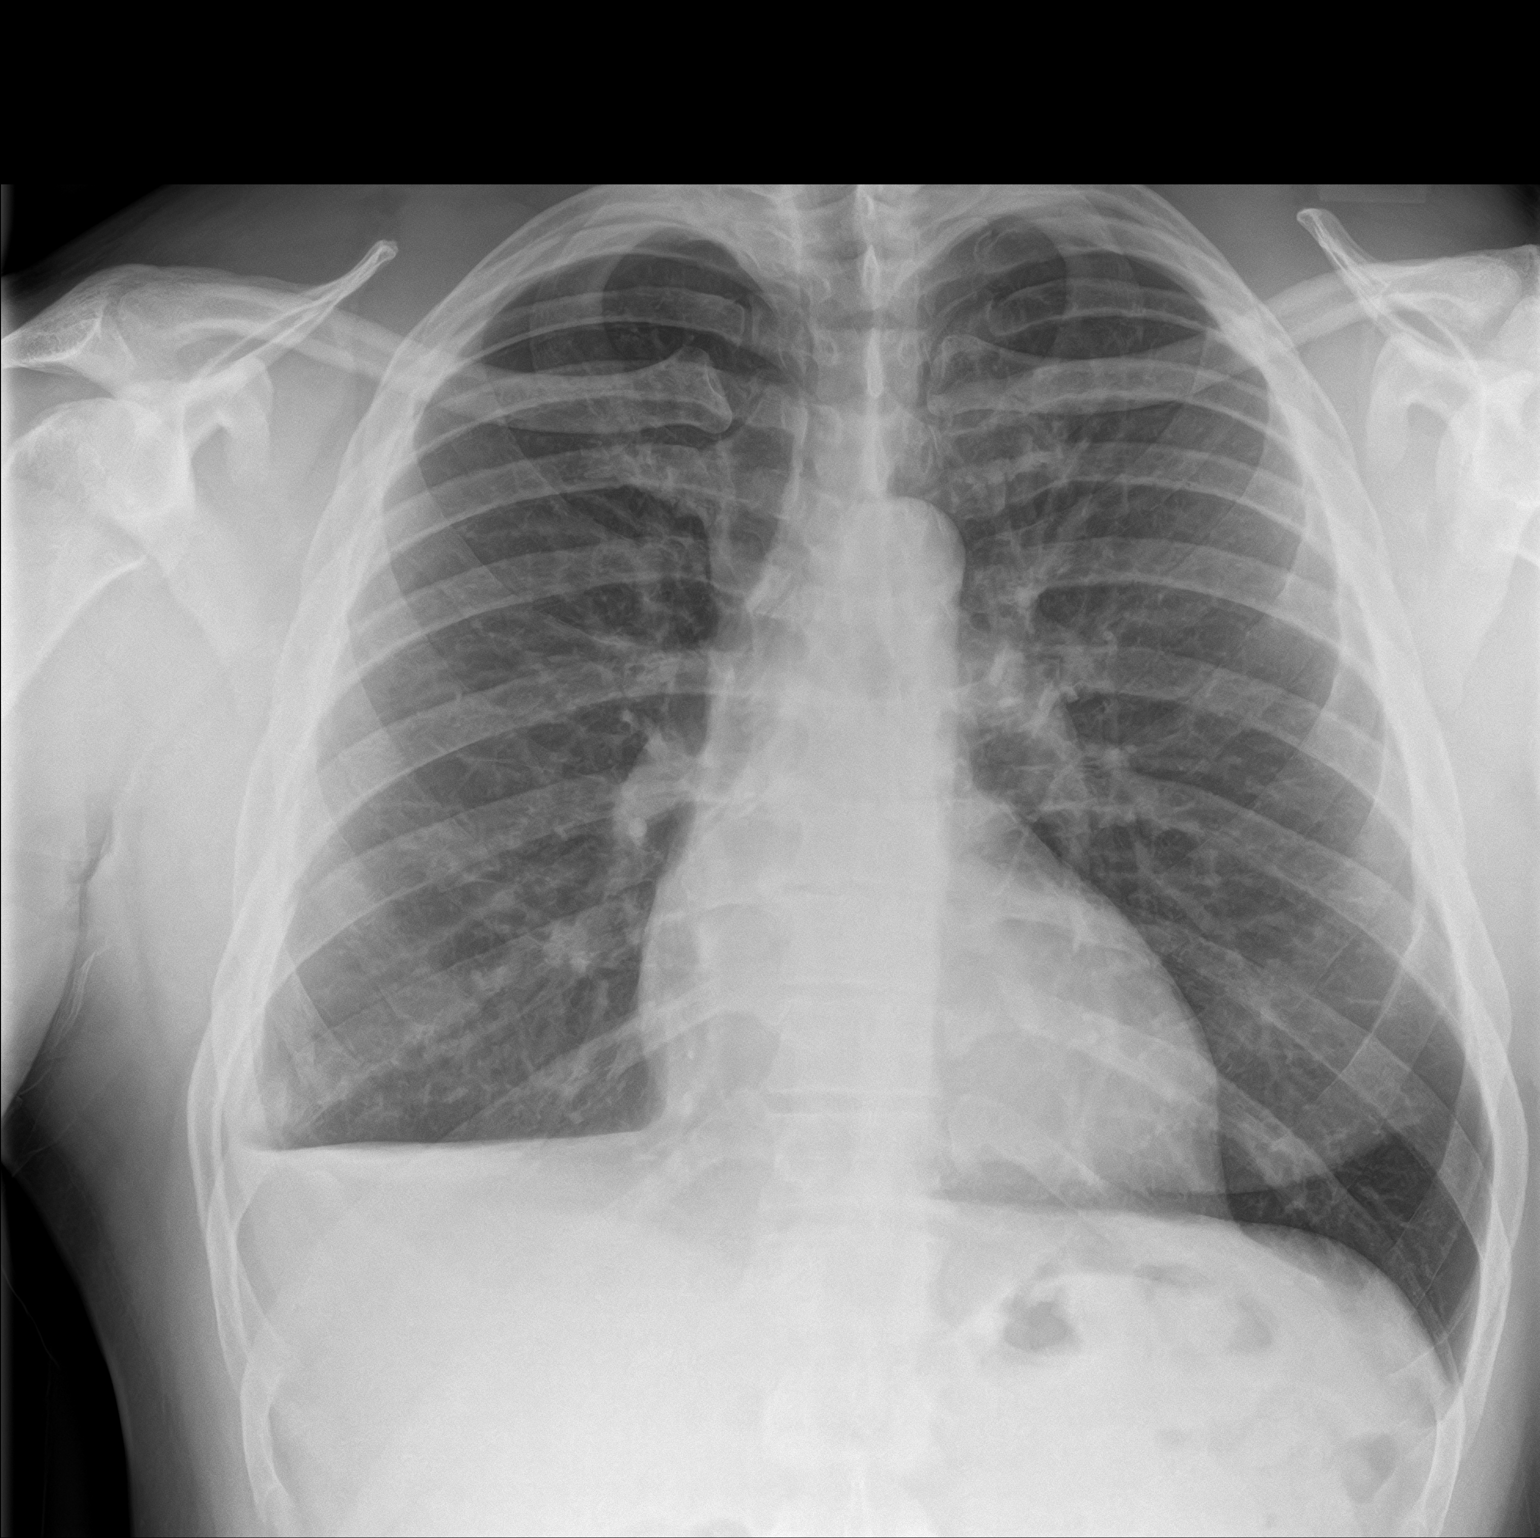
[im 2/2]
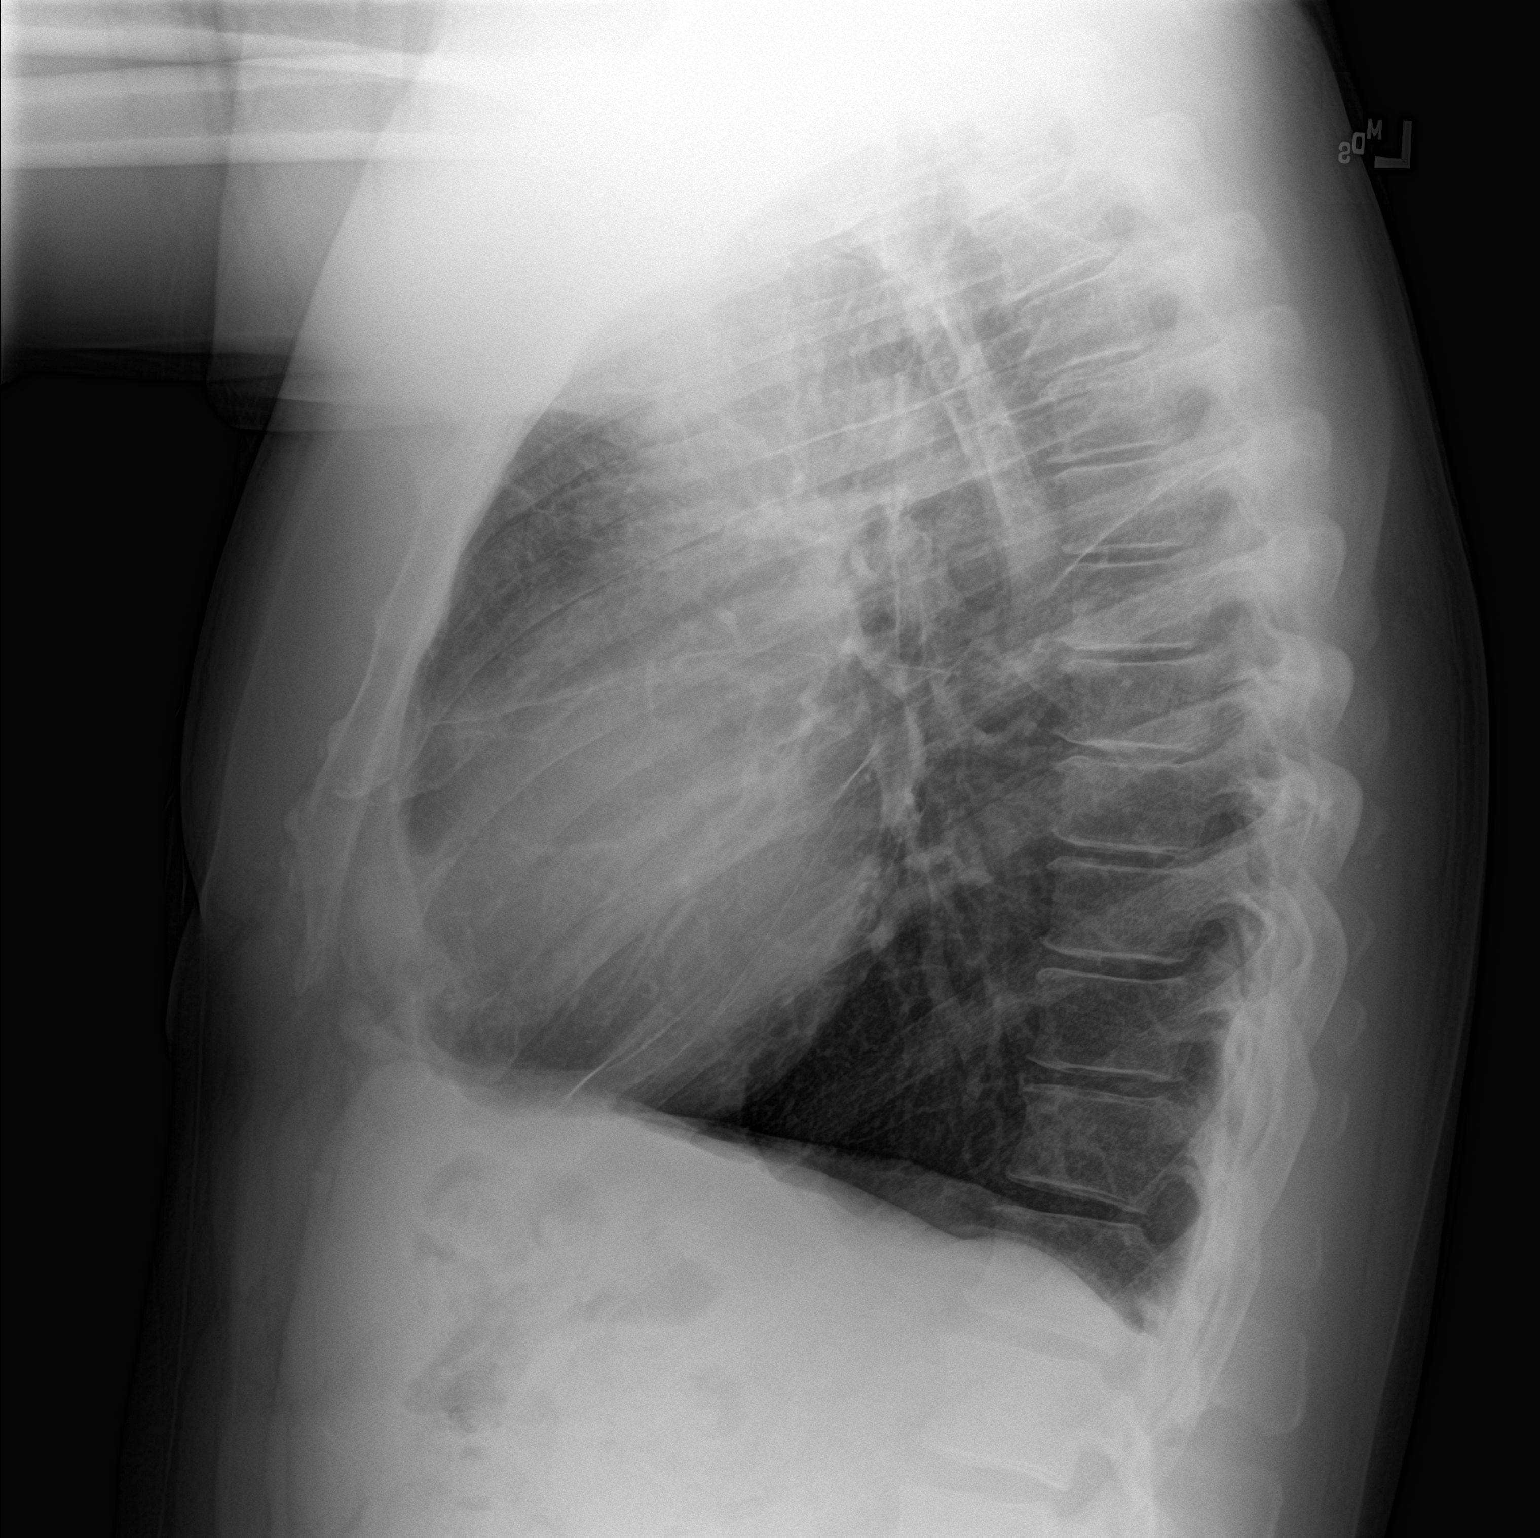

[2 of 2 positions shown; findings below may reference images not displayed]

FINDINGS: Small right effusion blunting the right costophrenic angle. No left
effusion. No pneumothorax. Normal heart size and vascularity. No
focal airspace process, collapse or consolidation. No acute osseous
finding.
IMPRESSION: Small right pleural effusion. No other acute finding by plain
radiography.

## 2022-05-22 ENCOUNTER — Telehealth: Payer: Self-pay | Admitting: *Deleted

## 2022-05-22 NOTE — Telephone Encounter (Signed)
"  I have an appointment scheduled for the 14th.  I am calling to reschedule my appointment because I just recently got my medication.  I haven't completed taking it.  I'm supposed to come there after taking all of the medication.  Give me a call back to reschedule it."

## 2022-05-27 ENCOUNTER — Ambulatory Visit: Payer: BC Managed Care – PPO | Admitting: Podiatry

## 2022-06-05 ENCOUNTER — Emergency Department (HOSPITAL_COMMUNITY)
Admission: EM | Admit: 2022-06-05 | Discharge: 2022-06-05 | Disposition: A | Discharge status: Home / Self Care | Payer: BC Managed Care – PPO | Attending: Emergency Medicine | Admitting: Emergency Medicine

## 2022-06-05 ENCOUNTER — Other Ambulatory Visit: Payer: Self-pay

## 2022-06-05 ENCOUNTER — Emergency Department (HOSPITAL_COMMUNITY): Payer: BC Managed Care – PPO

## 2022-06-05 ENCOUNTER — Encounter (HOSPITAL_COMMUNITY): Payer: Self-pay

## 2022-06-05 DIAGNOSIS — M545 Low back pain, unspecified: Secondary | ICD-10-CM | POA: Diagnosis present

## 2022-06-05 DIAGNOSIS — M5432 Sciatica, left side: Secondary | ICD-10-CM | POA: Diagnosis not present

## 2022-06-05 DIAGNOSIS — Y92410 Unspecified street and highway as the place of occurrence of the external cause: Secondary | ICD-10-CM | POA: Insufficient documentation

## 2022-06-05 MED ORDER — METHOCARBAMOL 500 MG PO TABS: 500.0000 mg | ORAL_TABLET | Freq: Two times a day (BID) | ORAL | 0 refills | Status: DC

## 2022-06-05 MED ORDER — PREDNISONE 50 MG PO TABS
60.0000 mg | ORAL_TABLET | Freq: Once | ORAL | Status: AC
  Administered 2022-06-05: 60 mg via ORAL
  Filled 2022-06-05: qty 1

## 2022-06-05 MED ORDER — PREDNISONE 10 MG PO TABS: ORAL_TABLET | ORAL | 0 refills | Status: AC

## 2022-06-05 MED ORDER — METHOCARBAMOL 500 MG PO TABS: 500.0000 mg | ORAL_TABLET | Freq: Two times a day (BID) | ORAL | 0 refills | Status: AC

## 2022-06-05 MED ORDER — PREDNISONE 10 MG PO TABS: ORAL_TABLET | ORAL | 0 refills | Status: DC
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
## 2022-06-05 NOTE — ED Notes (Signed)
Patient transported to X-ray 
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
## 2022-08-26 ENCOUNTER — Ambulatory Visit: Payer: BC Managed Care – PPO | Admitting: Podiatry

## 2022-09-16 ENCOUNTER — Ambulatory Visit: Payer: BC Managed Care – PPO | Admitting: Podiatry
